# Patient Record
Sex: Female | Born: 1995 | Race: Black or African American | Hispanic: No | Marital: Single | Smoking: Never smoker
Health system: Southern US, Community
[De-identification: ages and names within clinical notes are randomized; demographics above are authoritative.]

## PROBLEM LIST (undated history)

## (undated) ENCOUNTER — Inpatient Hospital Stay: Payer: Self-pay

## (undated) DIAGNOSIS — IMO0002 Reserved for concepts with insufficient information to code with codable children: Secondary | ICD-10-CM

## (undated) DIAGNOSIS — IMO0001 Reserved for inherently not codable concepts without codable children: Secondary | ICD-10-CM

## (undated) DIAGNOSIS — Z3490 Encounter for supervision of normal pregnancy, unspecified, unspecified trimester: Secondary | ICD-10-CM

---
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
## 2015-02-04 IMAGING — US US OB < 14 WEEKS - US OB TV
1 series · 14 of 28 positions shown · non-contrast
Comparison: None.

CLINICAL DATA: Nausea, vomiting and diarrhea for 1 week, diffuse
abdominal pain. Unknown last menstrual period. Positive pregnancy
test.

EXAM:
OBSTETRIC <14 WK US AND TRANSVAGINAL OB US
TECHNIQUE: Both transabdominal and transvaginal ultrasound examinations were
performed for complete evaluation of the gestation as well as the
maternal uterus, adnexal regions, and pelvic cul-de-sac.
Transvaginal technique was performed to assess early pregnancy.

[Series 1: us ob < 14 weeks - us ob tv · 0.20mm/px · 14 of 64 slices shown]
[im 3/64]
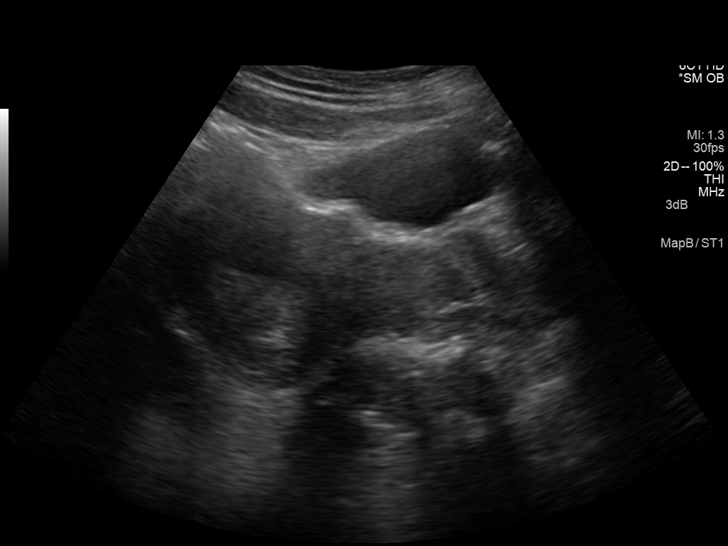
[im 8/64]
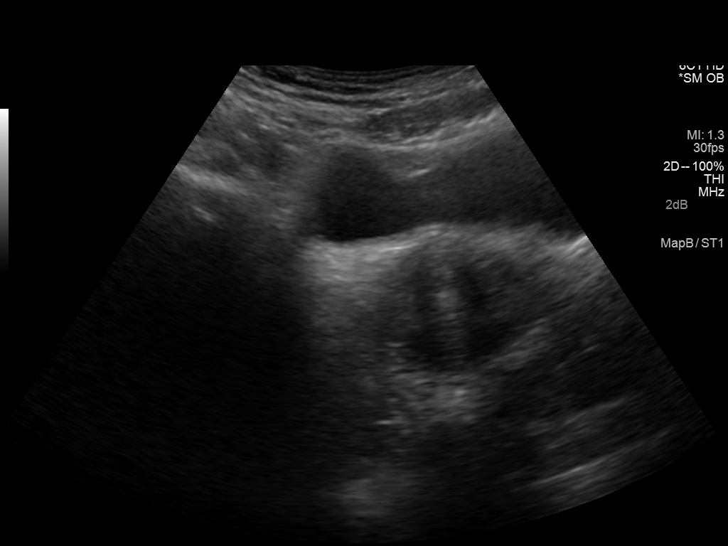
[im 12/64]
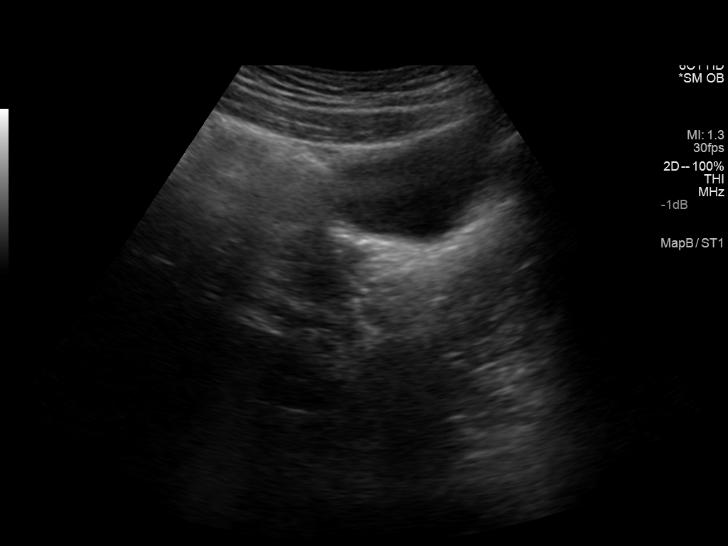
[im 17/64]
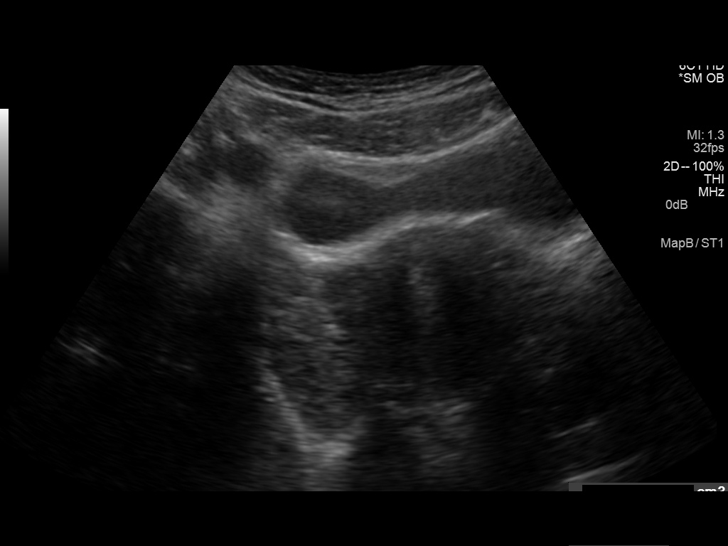
[im 22/64]
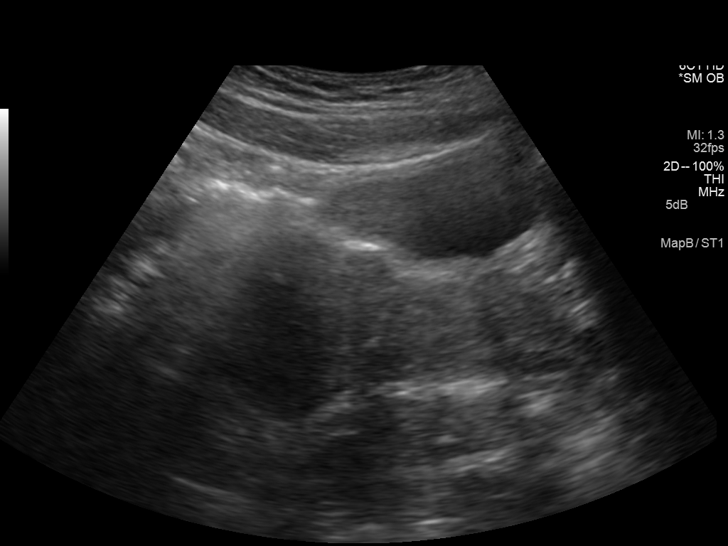
[im 26/64]
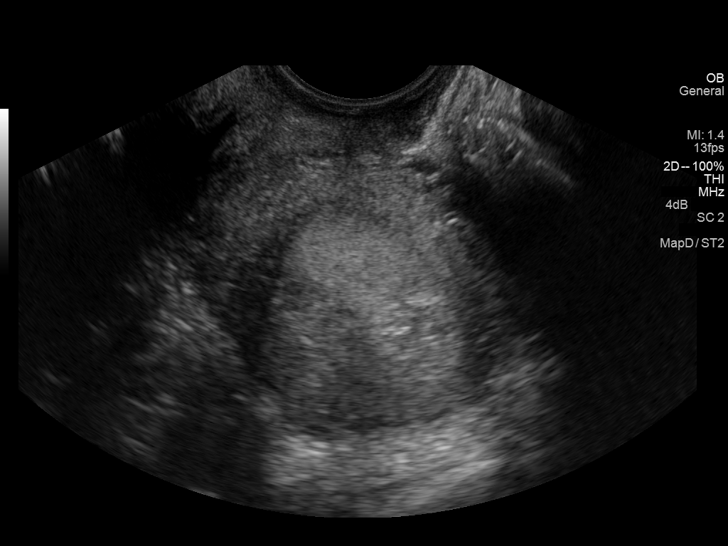
[im 31/64]
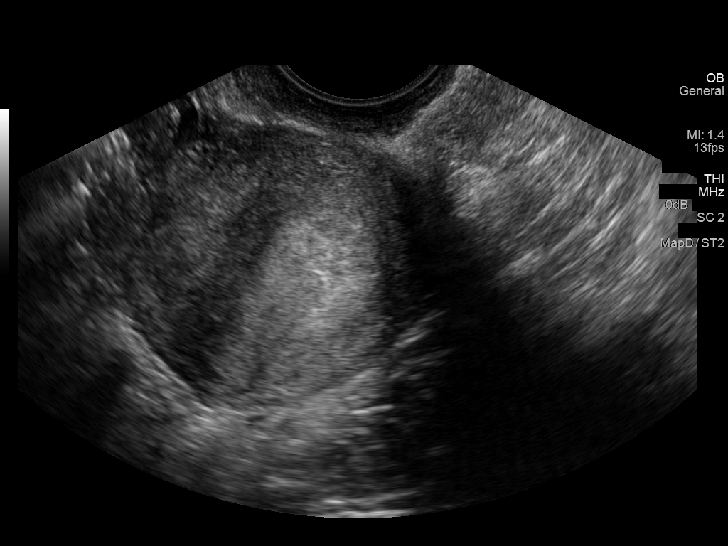
[im 36/64]
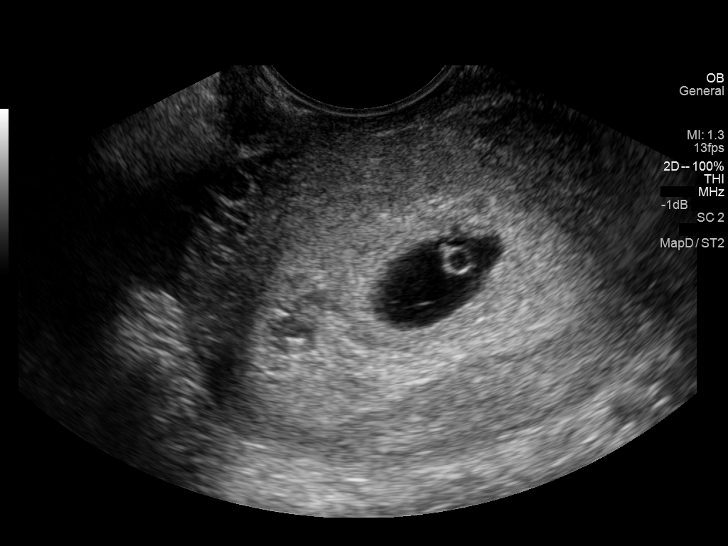
[im 40/64]
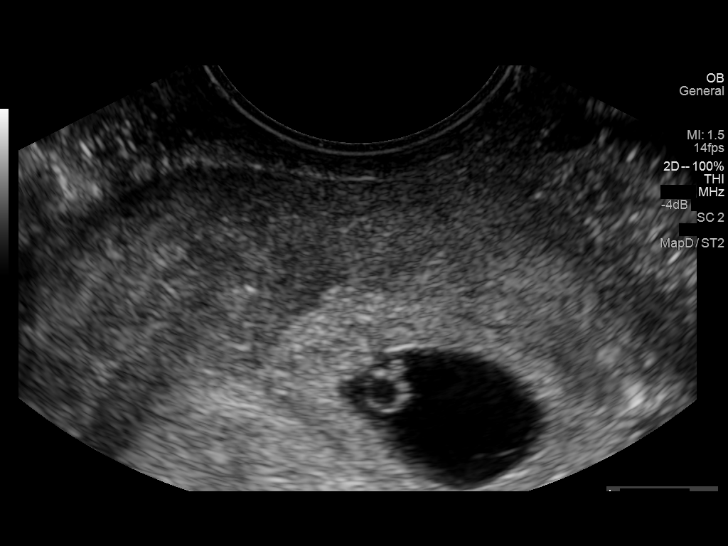
[im 45/64]
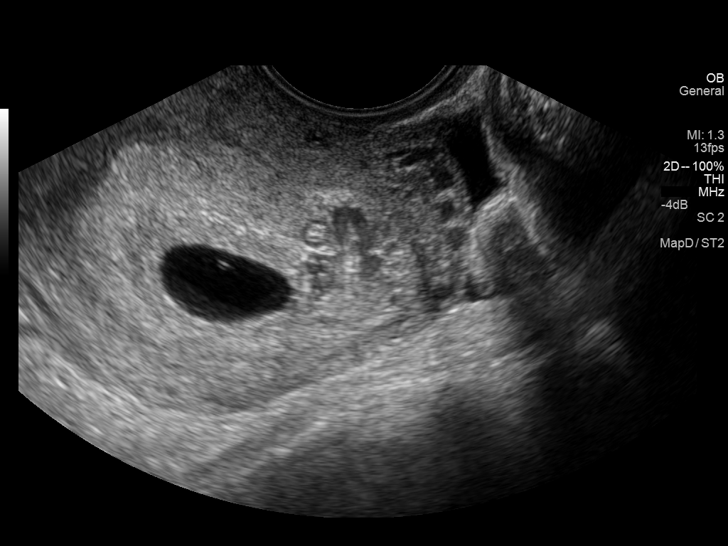
[im 50/64]
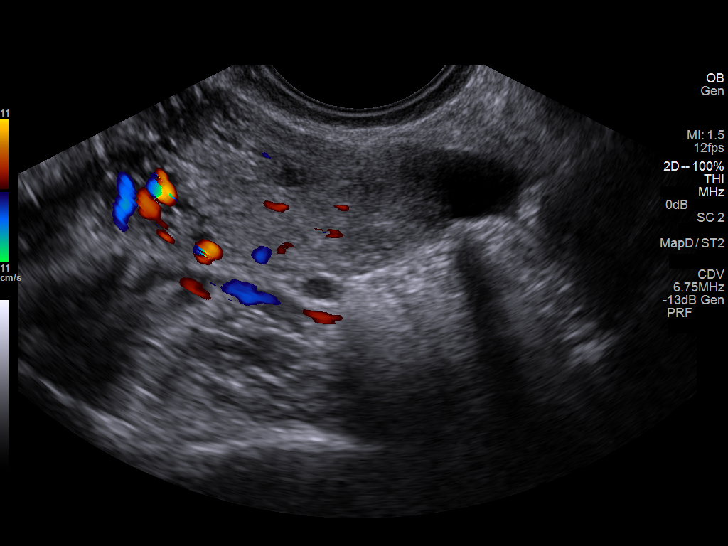
[im 54/64]
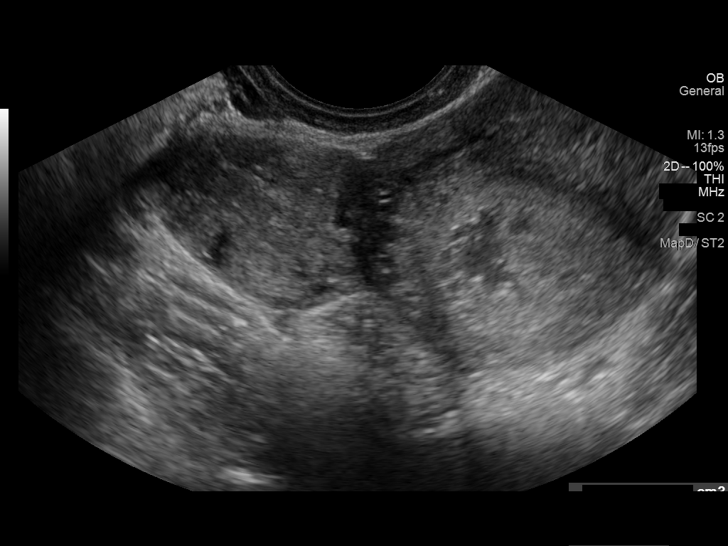
[im 59/64]
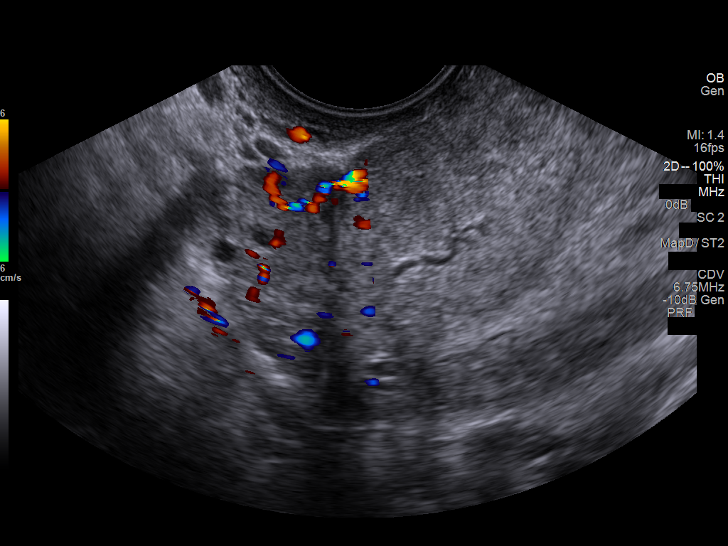
[im 64/64]
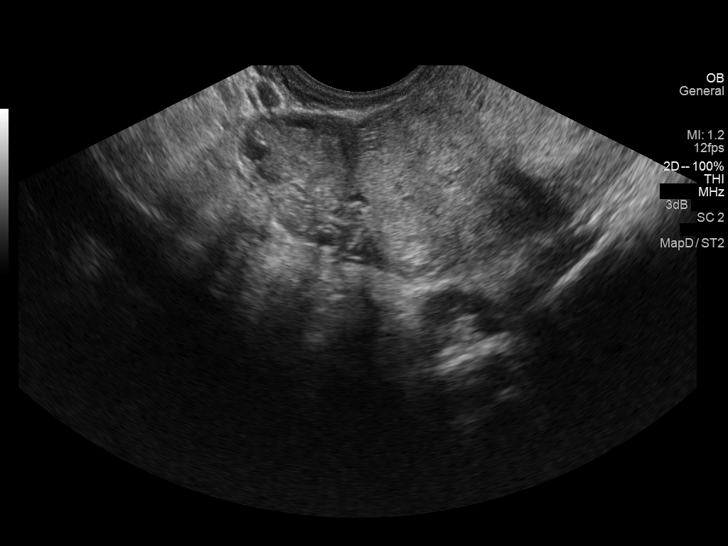

[14 of 28 positions shown; findings below may reference images not displayed]

FINDINGS: Intrauterine gestational sac: Visualized/normal in shape.

Yolk sac:  Present

Embryo:  Present

Cardiac Activity: Present

Heart Rate:  108 bpm

MSD:  2  mm   5 w   5  d

US EDC: October 27, 2014

Maternal uterus/adnexae: The subchorionic hemorrhage. Normal
appearance of the adnexae. No free fluid.
IMPRESSION: Single live intrauterine pregnancy, gestational age 5 weeks and 5
days by ultrasound, EDD October 27, 2014 without immediate
complications.

  By: Baxi Lance

## 2015-04-01 ENCOUNTER — Emergency Department
Admission: EM | Admit: 2015-04-01 | Discharge: 2015-04-01 | Disposition: A | Discharge status: Home / Self Care | Payer: Medicaid Other | Attending: Emergency Medicine | Admitting: Emergency Medicine

## 2015-04-01 ENCOUNTER — Encounter: Payer: Self-pay | Admitting: Emergency Medicine

## 2015-04-01 DIAGNOSIS — L02416 Cutaneous abscess of left lower limb: Secondary | ICD-10-CM | POA: Insufficient documentation

## 2015-04-01 DIAGNOSIS — L03116 Cellulitis of left lower limb: Secondary | ICD-10-CM | POA: Diagnosis not present

## 2015-04-01 DIAGNOSIS — Z79899 Other long term (current) drug therapy: Secondary | ICD-10-CM | POA: Diagnosis not present

## 2015-04-01 DIAGNOSIS — L0291 Cutaneous abscess, unspecified: Secondary | ICD-10-CM

## 2015-04-01 DIAGNOSIS — A4902 Methicillin resistant Staphylococcus aureus infection, unspecified site: Secondary | ICD-10-CM | POA: Insufficient documentation

## 2015-04-01 DIAGNOSIS — L0390 Cellulitis, unspecified: Secondary | ICD-10-CM

## 2015-04-01 MED ORDER — SULFAMETHOXAZOLE-TRIMETHOPRIM 800-160 MG PO TABS
1.0000 | ORAL_TABLET | Freq: Once | ORAL | Status: AC
  Administered 2015-04-01: 1 via ORAL
  Filled 2015-04-01: qty 1

## 2015-04-01 MED ORDER — SULFAMETHOXAZOLE-TRIMETHOPRIM 800-160 MG PO TABS: 1.0000 | ORAL_TABLET | Freq: Two times a day (BID) | ORAL | Status: DC

## 2015-04-01 NOTE — Discharge Instructions (Signed)
Abscess An abscess (boil or furuncle) is an infected area on or under the skin. This area is filled with yellowish-white fluid (pus) and other material (debris). HOME CARE   Only take medicines as told by your doctor.  If you were given antibiotic medicine, take it as directed. Finish the medicine even if you start to feel better.  If gauze is used, follow your doctor's directions for changing the gauze.  To avoid spreading the infection:  Keep your abscess covered with a bandage.  Wash your hands well.  Do not share personal care items, towels, or whirlpools with others.  Avoid skin contact with others.  Keep your skin and clothes clean around the abscess.  Keep all doctor visits as told. GET HELP RIGHT AWAY IF:   You have more pain, puffiness (swelling), or redness in the wound site.  You have more fluid or blood coming from the wound site.  You have muscle aches, chills, or you feel sick.  You have a fever. MAKE SURE YOU:   Understand these instructions.  Will watch your condition.  Will get help right away if you are not doing well or get worse.   This information is not intended to replace advice given to you by your health care provider. Make sure you discuss any questions you have with your health care provider.   Document Released: 10/18/2007 Document Revised: 10/31/2011 Document Reviewed: 07/15/2011 Elsevier Interactive Patient Education 2016 [employer].   MRSA Infection, Adult MRSA stands for methicillin-resistant Staphylococcus aureus. This type of infection is caused by Staphylococcus aureus bacteria that are no longer affected by the medicines used to kill them (drug resistant). Staphylococcus (staph) bacteria are normally found on the skin or in the nose of healthy people. In most cases, these bacteria do not cause infection. But if these resistant bacteria enter your body through a cut or sore, they can cause a serious infection on your skin or in  other parts of your body. There is a slight chance that the staph on your skin or in your nose is MRSA. There are two types of MRSA infections:  Hospital-acquired MRSA is bacteria that you get in the hospital.  Community-acquired MRSA is bacteria that you get somewhere other than in a hospital. RISK FACTORS Hospital-acquired MRSA is more common. You could be at risk for this infection if you are in the hospital and you:  Have surgery or a procedure.  Have an IV access or a catheter tube placed in your body.  Have weak resistance to germs (weakened immune system).  Are elderly.  Are on kidney dialysis. You could be at risk for community-acquired MRSA if you have a break in your skin and come into contact with MRSA. This may happen if you:  Play sports where there is skin-to-skin contact.  Live in a crowded setting, like a dormitory or a [employer].  Share towels, razors, or sports equipment with other people. SYMPTOMS  Symptoms of hospital-acquired MRSA depend on where MRSA has spread. Symptoms may include:  Wound infection.  Skin infection.  Rash.  Pneumonia.  Fever and chills.  Difficulty breathing.  Chest pain. Community-acquired MRSA is most likely to start as a scratch or cut that becomes infected. Symptoms may include:  A pus-filled pimple.  A boil on your skin.  Pus draining from your skin.  A sore (abscess) under your skin or somewhere in your body.  Fever with or without chills. DIAGNOSIS  The diagnosis of MRSA is  made by taking a sample from an infected area and sending it to a lab for testing. A lab technician can grow (culture) MRSA and check it under a microscope. The cultured MRSA can be tested to see which type of antibiotic medicine will work to treat it. Newer tests can identify MRSA more quickly by testing bacteria samples for MRSA genes. Your health care provider can diagnose MRSA using samples from:   Cuts or wounds in infected  areas.  Nasal swabs.  Saliva or cough specimens from deep in the lungs (sputum).  Urine.  Blood. You may also have:  Imaging studies (such as X-ray or MRI) to check if the infection has spread to the lungs, bones, or joints.  A culture and sensitivity test of blood or fluids from inside the joints. TREATMENT  Treatment depends on how severe, deep, or extensive the infection is. Very bad infections may require a hospital stay.  Some skin infections, such as a small boil or sore (abscess), may be treated by draining pus from the site of the infection.  More extensive surgery to drain pus may be necessary for deeper or more widespread soft tissue infections.  You may then have to take antibiotic medicine given by mouth or through a vein. You may start antibiotic treatment right away or after testing can be done to see what antibiotic medicine should be used. HOME CARE INSTRUCTIONS   Take your antibiotics as directed by your health care provider. Take the medicine as prescribed until it is finished.  Avoid close contact with those around you as much as possible. Do not use towels, razors, toothbrushes, bedding, or other items that will be used by others.  Wash your hands frequently for 15 seconds with soap and water. Dry your hands with a clean or disposable towel.  When you are not able to wash your hands, use hand sanitizer that is more than 60 percent alcohol.  Wash towels, sheets, or clothes in the washing machine with detergent and hot water. Dry them in a hot dryer.  Follow your health care provider's instructions for wound care. Wash your hands before and after changing your bandages.  Always shower after exercising.  Keep all cuts and scrapes clean and covered with a bandage.  Be sure to tell all your health care providers that you have MRSA so they are aware of your infection. SEEK MEDICAL CARE IF:  You have a cut, scrape, pimple, or boil that becomes red, swollen, or  painful or has pus in it.  You have pus draining from your skin.  You have an abscess under your skin or somewhere in your body. SEEK IMMEDIATE MEDICAL CARE IF:   You have symptoms of a skin infection with a fever or chills.  You have trouble breathing.  You have chest pain.  You have a skin wound and you become nauseous or start vomiting. MAKE SURE YOU:  Understand these instructions.  Will watch your condition.  Will get help right away if you are not doing well or get worse.   This information is not intended to replace advice given to you by your health care provider. Make sure you discuss any questions you have with your health care provider.   Document Released: 05/01/2005 Document Revised: 09/15/2014 Document Reviewed: 02/21/2013 Elsevier Interactive Patient Education 2016 [employer]   Take antibiotics as directed for infection. You may also purchase chlorhexidine soap at the pharmacy and shower daily for a week to help kill MRSA.  Follow-up as needed if not improving.

## 2015-04-01 NOTE — ED Provider Notes (Signed)
[facility] Emergency Department Provider Note  ____________________________________________  Time seen: Approximately 8:53 PM  I have reviewed the triage vital signs and the nursing notes.   HISTORY  Chief Complaint Abscess    HPI Julia Lewis is a 19 y.o. female with history of MRSA who presents with focal swelling to the left calf. Associated also with erythema. Area is tender and is improving overall. However she wanted the area evaluate. No fevers and chills.   Past Medical History  Diagnosis Date  . Asthma   . IUGR (intrauterine growth restriction)   . Teen mom   . Unplanned pregnancy     Patient Active Problem List   Diagnosis Date Noted  . Vaginal delivery 10/23/2014  . IUGR (intrauterine growth restriction) 10/13/2014  . Intrauterine growth restriction affecting antepartum care of mother   . [redacted] weeks gestation of pregnancy   . Poor fetal growth affecting management of mother in third trimester, antepartum   . Indication for care in labor and delivery, antepartum 09/16/2014    History reviewed. No pertinent past surgical history.  Current Outpatient Rx  Name  Route  Sig  Dispense  Refill  . albuterol (PROVENTIL HFA;VENTOLIN HFA) 108 (90 BASE) MCG/ACT inhaler   Inhalation   Inhale 2 puffs into the lungs every 6 (six) hours as needed for wheezing or shortness of breath.   1 Inhaler   2   . HYDROcodone-acetaminophen (NORCO) 5-325 MG per tablet   Oral   Take 1 tablet by mouth every 6 (six) hours as needed for moderate pain.   20 tablet   0   . ibuprofen (ADVIL,MOTRIN) 600 MG tablet   Oral   Take 1 tablet (600 mg total) by mouth every 6 (six) hours.   30 tablet   0   . methylPREDNISolone (MEDROL DOSEPAK) 4 MG TBPK tablet      Take Tapered dose as directed   21 tablet   0   . norethindrone (ORTHO MICRONOR) 0.35 MG tablet   Oral   Take 1 tablet (0.35 mg total) by mouth daily.   1 Package   11   . Prenatal Vit-Fe  Fumarate-FA (PRENATAL MULTIVITAMIN) TABS tablet   Oral   Take 1 tablet by mouth daily at 12 noon.         . sulfamethoxazole-trimethoprim (BACTRIM DS,SEPTRA DS) 800-160 MG tablet   Oral   Take 1 tablet by mouth 2 (two) times daily.   14 tablet   0   . witch hazel-glycerin (TUCKS) pad   Topical   Apply 1 application topically as needed for hemorrhoids.   40 each   12     Allergies Review of patient's allergies indicates no known allergies.  Family History  Problem Relation Age of Onset  . Stroke Maternal Grandmother 15    Social History Social History  Substance Use Topics  . Smoking status: Never Smoker   . Smokeless tobacco: Never Used  . Alcohol Use: No    Review of Systems Constitutional: No fever/chills Eyes: No visual changes. ENT: No sore throat. Cardiovascular: Denies chest pain. Respiratory: Denies shortness of breath. Gastrointestinal: No abdominal pain.  No nausea, no vomiting.  No diarrhea.  No constipation. Genitourinary: Negative for dysuria. Musculoskeletal: Negative for back pain. Skin: Negative for rash. Neurological: Negative for headaches, focal weakness or numbness. 10-point ROS otherwise negative.  ____________________________________________   PHYSICAL EXAM:  VITAL SIGNS: ED Triage Vitals  Enc Vitals Group     BP  04/01/15 2012 105/54 mmHg     Pulse Rate 04/01/15 2012 56     Resp 04/01/15 2012 15     Temp 04/01/15 2012 98.2 F (36.8 C)     Temp Source 04/01/15 2012 Oral     SpO2 04/01/15 2012 100 %     Weight 04/01/15 2012 125 lb (56.7 kg)     Height 04/01/15 2012 5\' 1"  (1.549 m)     Head Cir --      Peak Flow --      Pain Score 04/01/15 2023 8     Pain Loc --      Pain Edu? --      Excl. in GC? --     Constitutional: Alert and oriented. Well appearing and in no acute distress. Eyes: Conjunctivae are normal. PERRL. EOMI. Ears:  Clear with normal landmarks. No erythema. Head: Atraumatic. Nose: No  congestion/rhinnorhea. Mouth/Throat: Mucous membranes are moist.  Oropharynx non-erythematous. No lesions. Neck:  Supple.  No adenopathy.   Cardiovascular: Normal rate, regular rhythm. Grossly normal heart sounds.  Good peripheral circulation. Respiratory: Normal respiratory effort.  No retractions. Lungs CTAB. Gastrointestinal: Soft and nontender. No distention. No abdominal bruits. No CVA tenderness. Musculoskeletal: Nml ROM of upper and lower extremity joints. Neurologic:  Normal speech and language. No gross focal neurologic deficits are appreciated. No gait instability. Skin: 1 cm induration to the left calf with surrounding erythema. No pointing or fluctuance. Psychiatric: Mood and affect are normal. Speech and behavior are normal.  ____________________________________________   LABS (all labs ordered are listed, but only abnormal results are displayed)  Labs Reviewed - No data to display ____________________________________________  EKG   ____________________________________________  RADIOLOGY   ____________________________________________   PROCEDURES  Procedure(s) performed: None  Critical Care performed: No  ____________________________________________   INITIAL IMPRESSION / ASSESSMENT AND PLAN / ED COURSE  Pertinent labs & imaging results that were available during my care of the patient were reviewed by me and considered in my medical decision making (see chart for details).  19 year old with history of MRSA who presents with indurated area and surrounding erythema to the left calf. She reports actual improvement in the infection becoming less tender. Still recommended antibiotics, Septra DS for 1 week. Also instructed in Hibiclens soap treatment. May [facility] as needed, or return to the emergency room for any concerns. ____________________________________________   FINAL CLINICAL IMPRESSION(S) / ED DIAGNOSES  Final diagnoses:  Abscess  and cellulitis  MRSA (methicillin resistant Staphylococcus aureus)      Julia Lewis Julia Karel, PA-C 04/01/15 2055  Sharman CheekPhillip Stafford, MD 04/02/15 0111

## 2015-04-01 NOTE — ED Notes (Signed)
Pt presents with quarter size reddened and swollen area to back of left calf.

## 2015-09-03 IMAGING — US US OB FOLLOW-UP
1 series · 14 of 28 positions shown · non-contrast
Comparison: none

[Series 1: us ob follow-up · 0.28mm/px · 14 of 40 slices shown]
[im 2/40]
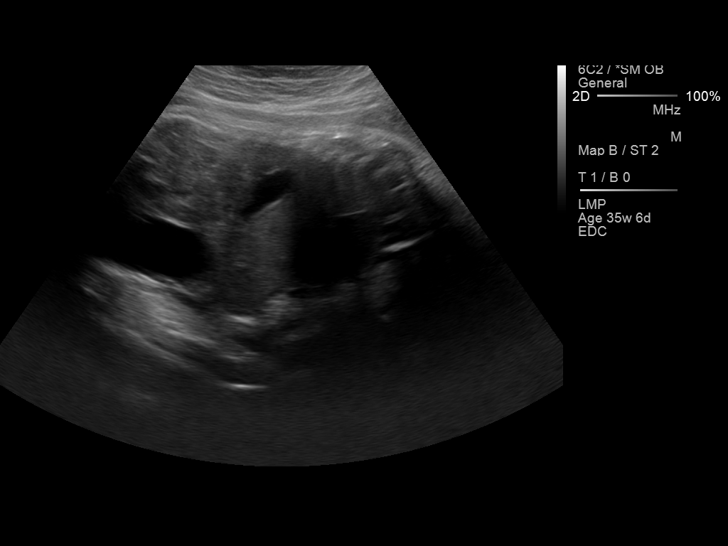
[im 5/40]
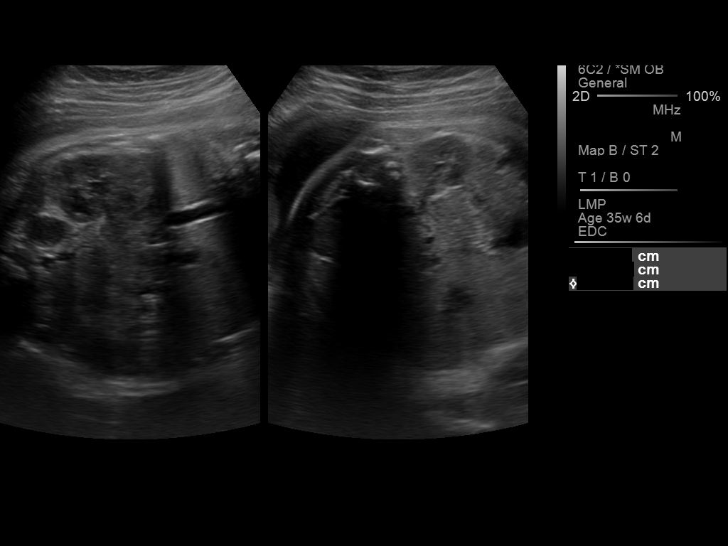
[im 8/40]
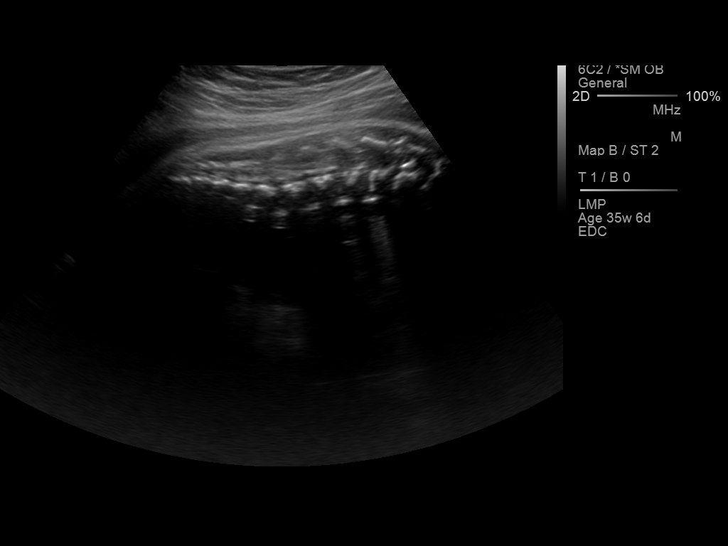
[im 11/40]
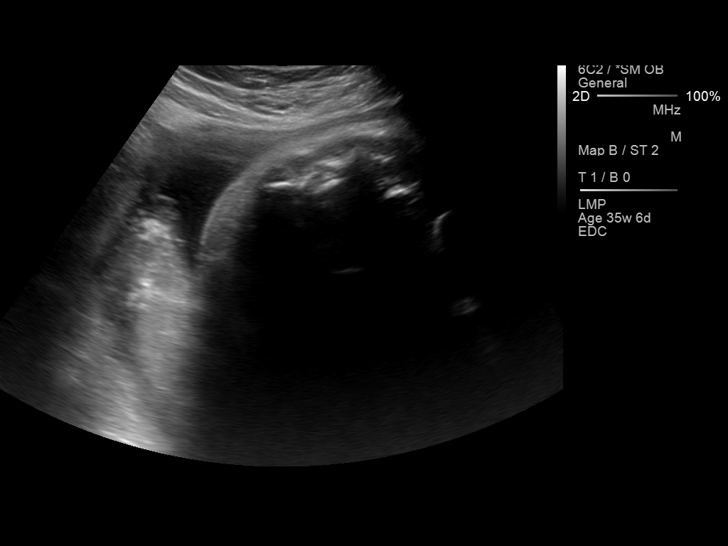
[im 14/40]
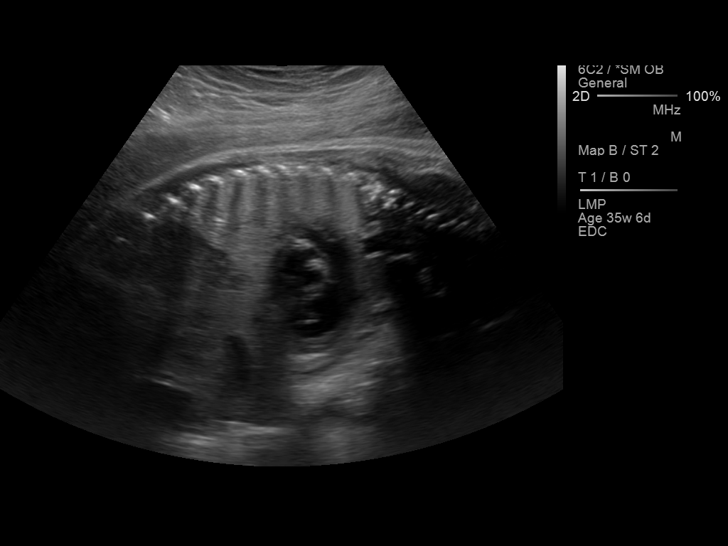
[im 16/40]
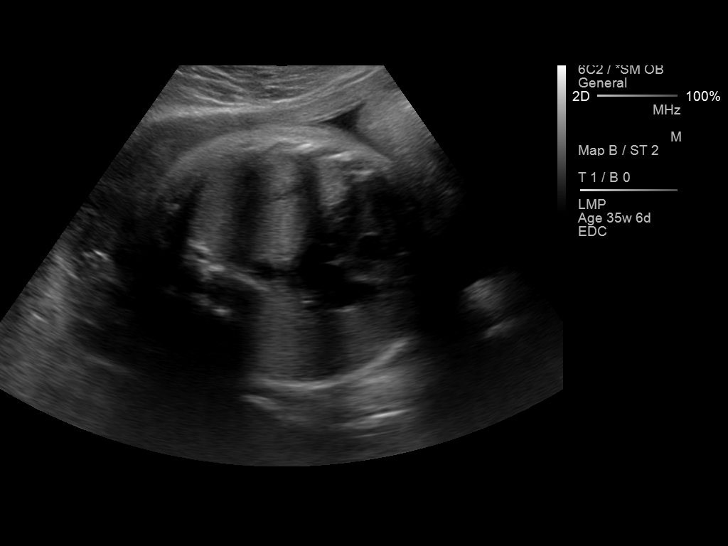
[im 19/40]
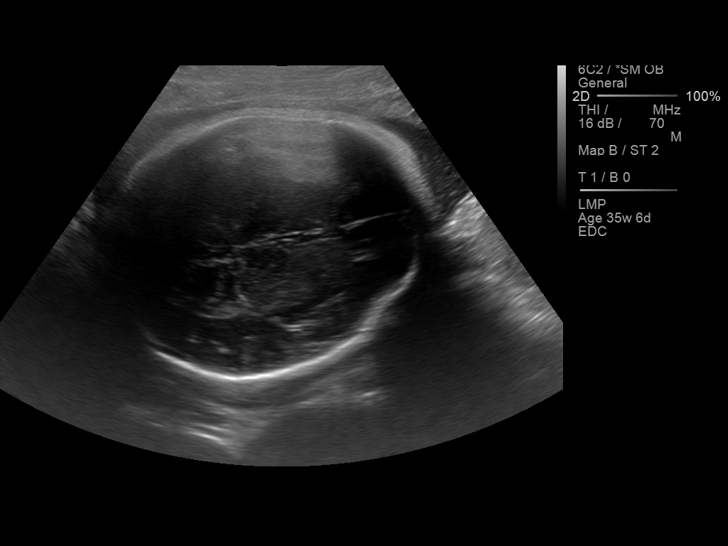
[im 22/40]
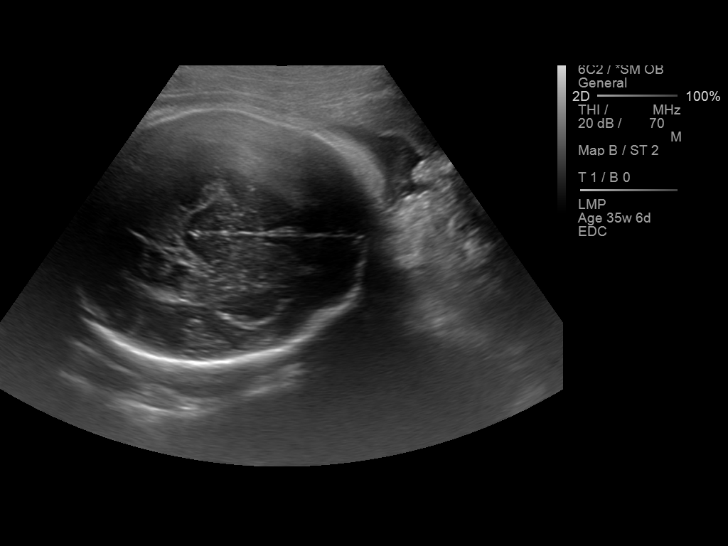
[im 25/40]
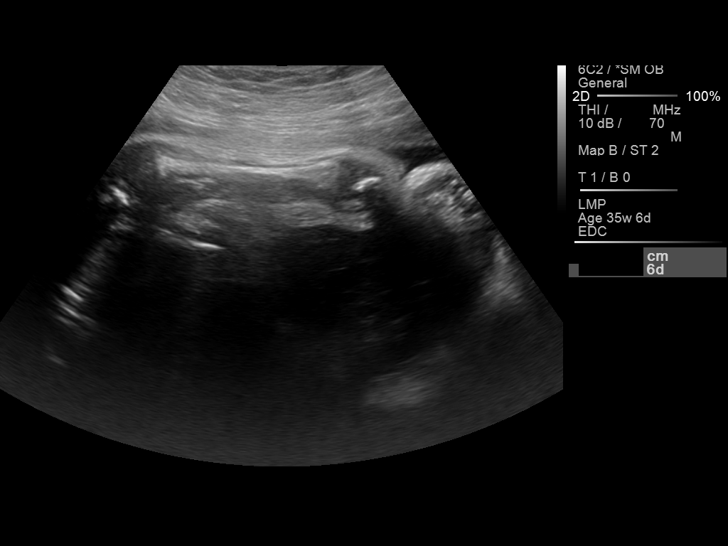
[im 28/40]
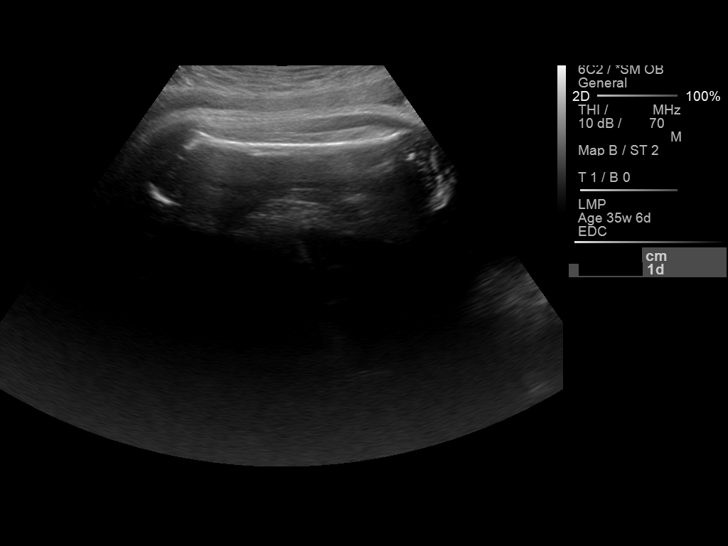
[im 31/40]
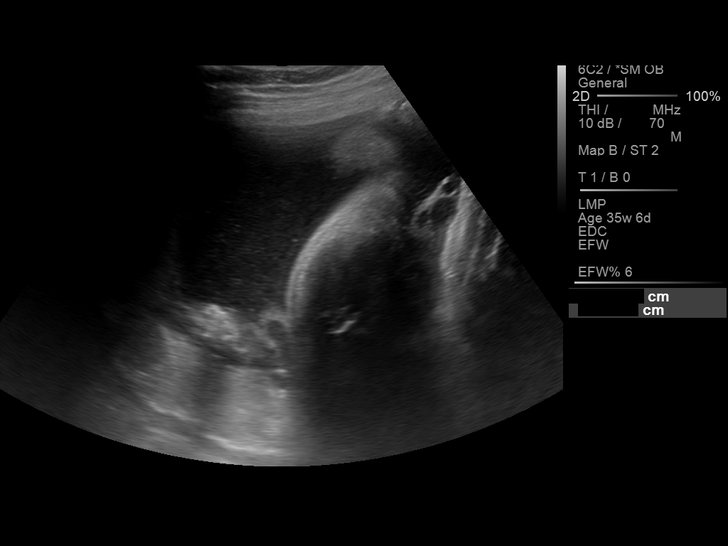
[im 34/40]
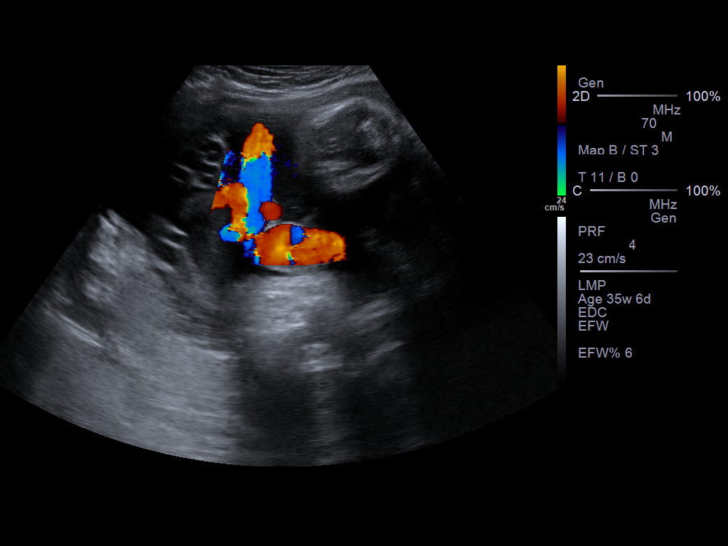
[im 37/40]
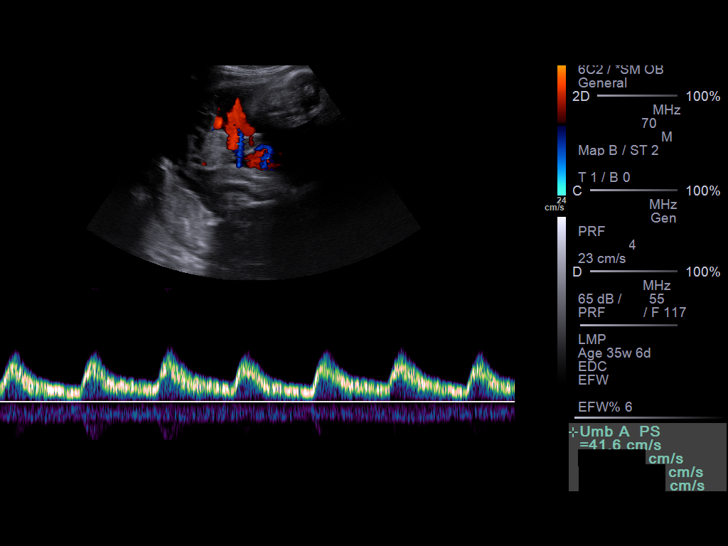
[im 40/40]
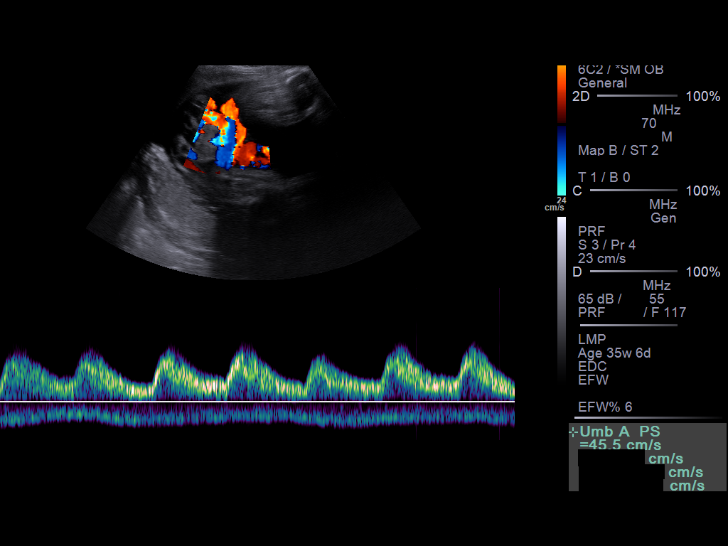

[14 of 28 positions shown; findings below may reference images not displayed]

Canned report from images found in remote index.

Refer to host system for actual result text.

## 2015-11-01 ENCOUNTER — Emergency Department
Admission: EM | Admit: 2015-11-01 | Discharge: 2015-11-01 | Disposition: A | Discharge status: Home / Self Care | Payer: Medicaid Other | Attending: Emergency Medicine | Admitting: Emergency Medicine

## 2015-11-01 DIAGNOSIS — F1290 Cannabis use, unspecified, uncomplicated: Secondary | ICD-10-CM | POA: Insufficient documentation

## 2015-11-01 DIAGNOSIS — J45901 Unspecified asthma with (acute) exacerbation: Secondary | ICD-10-CM | POA: Diagnosis not present

## 2015-11-01 DIAGNOSIS — R0602 Shortness of breath: Secondary | ICD-10-CM | POA: Diagnosis present

## 2015-11-01 MED ORDER — ALBUTEROL SULFATE (2.5 MG/3ML) 0.083% IN NEBU
2.5000 mg | INHALATION_SOLUTION | Freq: Once | RESPIRATORY_TRACT | Status: AC
  Administered 2015-11-01: 2.5 mg via RESPIRATORY_TRACT
  Filled 2015-11-01: qty 3

## 2015-11-01 MED ORDER — ALBUTEROL SULFATE HFA 108 (90 BASE) MCG/ACT IN AERS: 1.0000 | INHALATION_SPRAY | Freq: Four times a day (QID) | RESPIRATORY_TRACT | Status: AC | PRN

## 2015-11-01 NOTE — Discharge Instructions (Signed)
Asthma, Acute Bronchospasm °Acute bronchospasm caused by asthma is also referred to as an asthma attack. Bronchospasm means your air passages become narrowed. The narrowing is caused by inflammation and tightening of the muscles in the air tubes (bronchi) in your lungs. This can make it hard to breathe or cause you to wheeze and cough. °CAUSES °Possible triggers are: °· Animal dander from the skin, hair, or feathers of animals. °· Dust mites contained in house dust. °· Cockroaches. °· Pollen from trees or grass. °· Mold. °· Cigarette or tobacco smoke. °· Air pollutants such as dust, household cleaners, hair sprays, aerosol sprays, paint fumes, strong chemicals, or strong odors. °· Cold air or weather changes. Cold air may trigger inflammation. Winds increase molds and pollens in the air. °· Strong emotions such as crying or laughing hard. °· Stress. °· Certain medicines such as aspirin or beta-blockers. °· Sulfites in foods and drinks, such as dried fruits and wine. °· Infections or inflammatory conditions, such as a flu, cold, or inflammation of the nasal membranes (rhinitis). °· Gastroesophageal reflux disease (GERD). GERD is a condition where stomach acid backs up into your esophagus. °· Exercise or strenuous activity. °SIGNS AND SYMPTOMS  °· Wheezing. °· Excessive coughing, particularly at night. °· Chest tightness. °· Shortness of breath. °DIAGNOSIS  °Your health care provider will ask you about your medical history and perform a physical exam. A chest X-ray or blood testing may be performed to look for other causes of your symptoms or other conditions that may have triggered your asthma attack.  °TREATMENT  °Treatment is aimed at reducing inflammation and opening up the airways in your lungs.  Most asthma attacks are treated with inhaled medicines. These include quick relief or rescue medicines (such as bronchodilators) and controller medicines (such as inhaled corticosteroids). These medicines are sometimes  given through an inhaler or a nebulizer. Systemic steroid medicine taken by mouth or given through an IV tube also can be used to reduce the inflammation when an attack is moderate or severe. Antibiotic medicines are only used if a bacterial infection is present.  °HOME CARE INSTRUCTIONS  °· Rest. °· Drink plenty of liquids. This helps the mucus to remain thin and be easily coughed up. Only use caffeine in moderation and do not use alcohol until you have recovered from your illness. °· Do not smoke. Avoid being exposed to secondhand smoke. °· You play a critical role in keeping yourself in good health. Avoid exposure to things that cause you to wheeze or to have breathing problems. °· Keep your medicines up-to-date and available. Carefully follow your health care provider's treatment plan. °· Take your medicine exactly as prescribed. °· When pollen or pollution is bad, keep windows closed and use an air conditioner or go to places with air conditioning. °· Asthma requires careful medical care. See your health care provider for a follow-up as advised. If you are more than [redacted] weeks pregnant and you were prescribed any new medicines, let your obstetrician know about the visit and how you are doing. Follow up with your health care provider as directed. °· After you have recovered from your asthma attack, make an appointment with your outpatient doctor to talk about ways to reduce the likelihood of future attacks. If you do not have a doctor who manages your asthma, make an appointment with a primary care doctor to discuss your asthma. °SEEK IMMEDIATE MEDICAL CARE IF:  °· You are getting worse. °· You have trouble breathing. If severe, call your local   emergency services (911 in the [place]  You develop chest pain or discomfort.  You are vomiting.  You are not able to keep fluids down.  You are coughing up yellow, green, brown, or bloody sputum.  You have a fever and your symptoms suddenly get worse.  You have  trouble swallowing. MAKE SURE YOU:   Understand these instructions.  Will watch your condition.  Will get help right away if you are not doing well or get worse.   This information is not intended to replace advice given to you by your health care provider. Make sure you discuss any questions you have with your health care provider.   Document Released: 08/16/2006 Document Revised: 05/06/2013 Document Reviewed: 11/06/2012 Elsevier Interactive Patient Education 2016 Elsevier Inc.  Asthma Action Plan, Adult Name: ________________________________ Date: _______ Follow-Up Visit With Health Care Provider Bring your medicines to your follow-up visits.  Health care provider name: ____________________  Telephone: ____________________  How often should I see my health care provider? ____________________ The actions that you should take to control your asthma are based on the symptoms that you are having. The condition can be divided into 3 zones: the green zone, yellow zone, and red zone. Follow the action steps for the zone that you are in each day. GREEN ZONE: WHEN ASTHMA IS UNDER CONTROL SIGNS AND SYMPTOMS You may not have any symptoms while you are in the green zone. This means that you:  Have no coughing or wheezing, even while you are working or playing.  Sleep through the night.  Are breathing well. If you use a peak flow meter: The peak flow is above ______ (80% of your personal best or greater). You should take these medicines every day:  Controller medicine and dosage: ________________  Controller medicine and dosage: ________________  Controller medicine and dosage: ________________  Controller medicine and dosage: ________________  Before exercise, use this reliever or rescue medicine: ________________ Call your health care provider if:  You are using a reliever or rescue medicine more than 2-3 times per week. YELLOW ZONE: WHEN ASTHMA IS GETTING WORSE SIGNS AND  SYMPTOMS When you are in the yellow zone, you may have symptoms that interfere with exercise, are noticeably worse after exposure to triggers, or are worse at the first sign of a cold (upper respiratory infection). These may include:  Waking from sleep.  Coughing, especially at night or first thing in the morning.  Mild wheezing.  Chest tightness. If you use a peak flow meter: The peak flow is _____ to _____ (50-79% of your personal best). Add the following medicine to the ones that you use daily:  Reliever or rescue medicine and dosage: ________________  Additional medicine and dosage: ________________ Call your health care provider if:  You are using a reliever or rescue medicine more than 2-3 times per week.  You remain in the yellow zone for _____ hours. RED ZONE: WHEN ASTHMA IS SEVERE  SIGNS AND SYMPTOMS You will likely feel distressed and have symptoms at rest that restrict your activity. You are in the red zone if:  Your breathing is hard and quickly.  Your nose opens wide, your ribs show, and your neck muscles become visible when you breathe in.  Your lips, fingers, or toes are a bluish color.  You have trouble speaking in full sentences.  Your symptoms do not improve within 15-20 minutes after you use your reliever or rescue medicine (bronchodilator). If you use a peak flow meter: The peak flow is  less than _____ (less than 50% of your personal best). Call your local emergency services (911 in the [place] right away or seek help at the emergency department of the nearest hospital. Use your reliever or rescue medicine.  Start a nebulizer treatment or take 2-4 puffs from a metered-dose inhaler with a spacer.  Repeat this action every 15-20 minutes until help arrives. WHAT ARE SOME COMMON ASTHMA TRIGGERS? Discuss your asthma triggers with your health care provider. Some common triggers are:  Dander from the skin, hair, or feathers of animals.  Dust  mites.  Cockroaches.  Pollen from trees or grass.  Mold.  Cigarette or tobacco smoke.  Air pollutants, such as dust, household cleaners, hair sprays, aerosol sprays, scented candles, paint fumes, strong chemicals, or strong odors.  Cold air or changes in weather. Cold air may cause inflammation. Winds increase molds and pollens in the air.  Strong emotions, such as crying or laughing hard.  Stress.  Certain medicines, such as aspirin or beta blockers.  Sulfites in foods and drinks, such as dried fruits and wine.  Infections or inflammatory conditions, such as:  Flu (influenza).  Upper respiratory tract infection.  Lower respiratory tract infection (pneumonia or bronchitis).  Inflammation of the nasal membranes (rhinitis).  Gastroesophageal reflux disease (GERD). GERD is a condition in which stomach acid comes up into the throat (esophagus).  Exercise or strenuous activity.   This information is not intended to replace advice given to you by your health care provider. Make sure you discuss any questions you have with your health care provider.   Document Released: 02/26/2009 Document Revised: 05/22/2014 Document Reviewed: 02/17/2014 Elsevier Interactive Patient Education [employer]2016 Elsevier Inc.

## 2015-11-01 NOTE — ED Notes (Signed)
Pt states " I think my asthma is flaring up".. States she has been wheezing, cough since yesterday.. Pt is in NAD at present.

## 2015-11-01 NOTE — ED Provider Notes (Signed)
[facility] Emergency Department Provider Note  ____________________________________________  Time seen: Approximately 8:29 AM  I have reviewed the triage vital signs and the nursing notes.   HISTORY  Chief Complaint Asthma    HPI Julia Lewis is a 20 y.o. female , NAD, presents to the emergency department with a few day history of wheezing and chest tightness. States she has a history of asthma that flares with weather change in increase in temperatures. Unfortunately missed her appointment with her primary care provider [facility] to get a refill on her albuterol inhaler due to transportation issues. Has not had an albuterol inhaler and a couple of months and has not had any symptoms. Has been on Qvar in the past in which she states did not help her symptoms long term. Does not use albuterol on a regular basis. Denies nasal congestion, runny nose, chest congestion, cough, back pain, headaches, fever, chills, body aches. No LOC or dizziness.   Past Medical History  Diagnosis Date  . Asthma   . IUGR (intrauterine growth restriction)   . Teen mom   . Unplanned pregnancy     Patient Active Problem List   Diagnosis Date Noted  . Vaginal delivery 10/23/2014  . IUGR (intrauterine growth restriction) 10/13/2014  . Intrauterine growth restriction affecting antepartum care of mother   . [redacted] weeks gestation of pregnancy   . Poor fetal growth affecting management of mother in third trimester, antepartum   . Indication for care in labor and delivery, antepartum 09/16/2014    History reviewed. No pertinent past surgical history.  Current Outpatient Rx  Name  Route  Sig  Dispense  Refill  . albuterol (PROVENTIL HFA;VENTOLIN HFA) 108 (90 Base) MCG/ACT inhaler   Inhalation   Inhale 1-2 puffs into the lungs every 6 (six) hours as needed for wheezing or shortness of breath.   1 Inhaler   0     Allergies Review of patient's allergies indicates no known  allergies.  Family History  Problem Relation Age of Onset  . Stroke Maternal Grandmother 36    Social History Social History  Substance Use Topics  . Smoking status: Never Smoker   . Smokeless tobacco: Never Used  . Alcohol Use: No     Review of Systems  Constitutional: No fever/chills Eyes: No visual changes.  ENT: No sore throat, Nasal congestion, sinus pressure, runny nose. Cardiovascular: No chest pain. Respiratory: Positive shortness of breath, wheezing. No cough, chest congestion. Musculoskeletal: Negative for back pain, general myalgias.  Skin: Negative for rash. Neurological: Negative for headaches, focal weakness or numbness. No LOC, tingling 10-point ROS otherwise negative.  ____________________________________________   PHYSICAL EXAM:  VITAL SIGNS: ED Triage Vitals  Enc Vitals Group     BP 11/01/15 0755 130/81 mmHg     Pulse Rate 11/01/15 0755 98     Resp 11/01/15 0755 19     Temp 11/01/15 0755 98.3 F (36.8 C)     Temp Source 11/01/15 0755 Oral     SpO2 11/01/15 0755 97 %     Weight 11/01/15 0755 160 lb (72.576 kg)     Height 11/01/15 0755  (1.575 m)     Head Cir --      Peak Flow --      Pain Score --      Pain Loc --      Pain Edu? --      Excl. in GC? --      Constitutional: Alert  and oriented. Well appearing and in no acute distress. Eyes: Conjunctivae are normal.  Head: Atraumatic. Neck: Supple with FROM. Hematological/Lymphatic/Immunilogical: No cervical lymphadenopathy. Cardiovascular: Normal rate, regular rhythm. Normal S1 and S2.  Good peripheral circulation. Respiratory: Trace wheezing throughout all lung fields but air noted in all lung fields. No rhonchi or rales. Normal respiratory effort without tachypnea or retractions.  Neurologic:  Normal speech and language. No gross focal neurologic deficits are appreciated. Normal gait and posture.  Skin:  Skin is warm, dry and intact. No rash noted. Psychiatric: Mood and affect are  normal. Speech and behavior are normal. Patient exhibits appropriate insight and judgement.   ____________________________________________   LABS  None ____________________________________________  EKG  None ____________________________________________  RADIOLOGY  None ____________________________________________    PROCEDURES  Procedure(s) performed: None    Medications  albuterol (PROVENTIL) (2.5 MG/3ML) 0.083% nebulizer solution 2.5 mg (2.5 mg Nebulization Given 11/01/15 0841)   No wheeze, rhonchi or rales after administration of albuterol nebulizer treatment. Normal respiratory effort without tachypnea or retractions.   ____________________________________________   INITIAL IMPRESSION / ASSESSMENT AND PLAN / ED COURSE  Patient's diagnosis is consistent with asthma exacerbation. Patient will be discharged home with prescriptions for albuterol inhaler to use as directed. Patient is to follow up with [facility] if symptoms persist past this treatment course and for regular follow up. Patient is given ED precautions to return to the ED for any worsening or new symptoms.    ____________________________________________  FINAL CLINICAL IMPRESSION(S) / ED DIAGNOSES  Final diagnoses:  Asthma exacerbation      NEW MEDICATIONS STARTED DURING THIS VISIT:  Discharge Medication List as of 11/01/2015  9:02 AM           Hope PigeonJami L Hagler, PA-C 11/01/15 0913  Myrna Blazeravid Matthew Schaevitz, MD 11/01/15 1538

## 2016-03-10 ENCOUNTER — Encounter: Payer: Self-pay | Admitting: Emergency Medicine

## 2016-03-10 ENCOUNTER — Emergency Department
Admission: EM | Admit: 2016-03-10 | Discharge: 2016-03-10 | Disposition: A | Discharge status: Home / Self Care | Payer: Medicaid Other | Attending: Emergency Medicine | Admitting: Emergency Medicine

## 2016-03-10 DIAGNOSIS — J45909 Unspecified asthma, uncomplicated: Secondary | ICD-10-CM | POA: Diagnosis present

## 2016-03-10 DIAGNOSIS — J45901 Unspecified asthma with (acute) exacerbation: Secondary | ICD-10-CM | POA: Insufficient documentation

## 2016-03-10 MED ORDER — ALBUTEROL SULFATE HFA 108 (90 BASE) MCG/ACT IN AERS: 2.0000 | INHALATION_SPRAY | Freq: Four times a day (QID) | RESPIRATORY_TRACT | 2 refills | Status: AC | PRN

## 2016-03-10 MED ORDER — PREDNISONE 20 MG PO TABS: 40.0000 mg | ORAL_TABLET | Freq: Every day | ORAL | 0 refills | Status: AC

## 2016-03-10 MED ORDER — IPRATROPIUM-ALBUTEROL 0.5-2.5 (3) MG/3ML IN SOLN
RESPIRATORY_TRACT | Status: AC
  Administered 2016-03-10: 6 mL via RESPIRATORY_TRACT
  Filled 2016-03-10: qty 6

## 2016-03-10 MED ORDER — PREDNISONE 20 MG PO TABS
40.0000 mg | ORAL_TABLET | Freq: Once | ORAL | Status: AC
  Administered 2016-03-10: 40 mg via ORAL

## 2016-03-10 MED ORDER — IPRATROPIUM-ALBUTEROL 0.5-2.5 (3) MG/3ML IN SOLN
6.0000 mL | Freq: Once | RESPIRATORY_TRACT | Status: AC
  Administered 2016-03-10: 6 mL via RESPIRATORY_TRACT

## 2016-03-10 MED ORDER — IPRATROPIUM-ALBUTEROL 0.5-2.5 (3) MG/3ML IN SOLN
3.0000 mL | Freq: Once | RESPIRATORY_TRACT | Status: AC
  Administered 2016-03-10: 3 mL via RESPIRATORY_TRACT
  Filled 2016-03-10: qty 3

## 2016-03-10 MED ORDER — PREDNISONE 20 MG PO TABS
ORAL_TABLET | ORAL | Status: AC
  Administered 2016-03-10: 40 mg via ORAL
  Filled 2016-03-10: qty 2

## 2016-03-10 NOTE — ED Provider Notes (Signed)
-----------------------------------------   7:51 AM on 03/10/2016 -----------------------------------------   Blood pressure 107/79, pulse 95, temperature 98.1 F (36.7 C), temperature source Oral, resp. rate 18, height 5\' 1"  (1.549 m), weight 126 lb (57.2 kg), last menstrual period 02/04/2016, SpO2 97 %, not currently breastfeeding.  Assuming care from Dr. Manson PasseyBrown of [name] is a 20 y.o. female with a chief complaint of Asthma .    In summary, 3F history of asthma who presents with an exacerbation after running out of her albuterol 2 months ago. No fever chills.patient received 1 DuoNeb treatment and prednisone. I reassessed her at this morning she still wheezing. We'll give her a second DuoNeb at this time. Patient's vital signs are within normal limits, normal work of breathing, satting well on room air.  _________________________ 10:53 AM on 03/10/2016 -----------------------------------------  Patient moving good air. We'll discharge home with instructions provided by Dr. Manson PasseyBrown and prescriptions. Recommended close follow-up with primary care doctor.    Julia Sicklearolina Josette Shimabukuro, MD 03/10/16 1054

## 2016-03-10 NOTE — ED Triage Notes (Signed)
Pt arrived via ems from home with complaints of chest pain and difficulty breathing. Per ems report pt was in no apparent distress. Pt has history of asthma and out of medication for 2-3 months. Pt complains of having asthma attack and feeling "wheezy."

## 2016-03-10 NOTE — ED Notes (Signed)
Pt speaking in complete sentences, cough noted. Alert and oriented. Sitting on stretcher texting. EMS gave albuterol treatment en route, pt received albuterol from EMS around 3am when she first called. Called EMS back around 6am to come to ED. EMS reports wheezing in upper lobes.

## 2016-03-10 NOTE — ED Provider Notes (Signed)
[facility] Emergency Department Provider Note    First MD Initiated Contact with Patient 03/10/16 [id number]     (approximate)  I have reviewed the triage vital signs and the nursing notes.   HISTORY  Chief Complaint Asthma    HPI Julia Lewis is a 20 y.o. female with history of asthma presents to the emergency department with "asthma attack" 1 day. Patient states that she ran out of her albuterol roughly 2-3 months ago. Patient denies any fever. However patient does admit to wheezing and intermittent cough.   Past Medical History:  Diagnosis Date  . Asthma   . IUGR (intrauterine growth restriction)   . Teen mom   . Unplanned pregnancy     Patient Active Problem List   Diagnosis Date Noted  . Vaginal delivery 10/23/2014  . IUGR (intrauterine growth restriction) 10/13/2014  . Intrauterine growth restriction affecting antepartum care of mother   . [redacted] weeks gestation of pregnancy   . Poor fetal growth affecting management of mother in third trimester, antepartum   . Indication for care in labor and delivery, antepartum 09/16/2014    Past surgical history None  Prior to Admission medications   Medication Sig Start Date End Date Taking? Authorizing Provider  albuterol (PROVENTIL HFA;VENTOLIN HFA) 108 (90 Base) MCG/ACT inhaler Inhale 1-2 puffs into the lungs every 6 (six) hours as needed for wheezing or shortness of breath. 11/01/15   Jami L Hagler, PA-C    Allergies No known drug allergies  Family History  Problem Relation Age of Onset  . Stroke Maternal Grandmother 64    Social History Social History  Substance Use Topics  . Smoking status: Never Smoker  . Smokeless tobacco: Never Used  . Alcohol use No    Review of Systems Constitutional: No fever/chills Eyes: No visual changes. ENT: No sore throat. Cardiovascular: Denies chest pain. Respiratory:Positive for wheezing and shortness of breath. Gastrointestinal: No abdominal pain.   No nausea, no vomiting.  No diarrhea.  No constipation. Genitourinary: Negative for dysuria. Musculoskeletal: Negative for back pain. Skin: Negative for rash. Neurological: Negative for headaches, focal weakness or numbness.  10-point ROS otherwise negative.  ____________________________________________   PHYSICAL EXAM:  VITAL SIGNS: ED Triage Vitals  Enc Vitals Group     BP 03/10/16 0628 135/78     Pulse Rate 03/10/16 0628 (!) 102     Resp 03/10/16 0628 (!) 24     Temp 03/10/16 0628 98.1 F (36.7 C)     Temp Source 03/10/16 0628 Oral     SpO2 03/10/16 0628 99 %     Weight --      Height --      Head Circumference --      Peak Flow --      Pain Score 03/10/16 0629 6     Pain Loc --      Pain Edu? --      Excl. in GC? --     Constitutional: Alert and oriented. Well appearing and in no acute distress. Eyes: Conjunctivae are normal. PERRL. EOMI. Head: Atraumatic. Nose: No congestion/rhinnorhea. Mouth/Throat: Mucous membranes are moist.  Oropharynx non-erythematous. Neck: No stridor.  No meningeal signs.  No cervical spine tenderness to palpation. Cardiovascular: Tachycardia regular rhythm. Good peripheral circulation. Grossly normal heart sounds. Respiratory: Tachypnea  No retractions. Bilateral expiratory wheezes Gastrointestinal: Soft and nontender. No distention.  Musculoskeletal: No lower extremity tenderness nor edema. No gross deformities of extremities. Neurologic:  Normal speech and language. No  gross focal neurologic deficits are appreciated.  Skin:  Skin is warm, dry and intact. No rash noted. Psychiatric: Mood and affect are normal. Speech and behavior are normal.    Procedures   _   INITIAL IMPRESSION / ASSESSMENT AND PLAN / ED COURSE  Pertinent labs & imaging results that were available during my care of the patient were reviewed by me and considered in my medical decision making (see chart for details).  Patient given 2 nebs 2 with prednisone 40  mg. Patient will be prescribed albuterol inhaler and prednisone for home.   Clinical Course    ____________________________________________  FINAL CLINICAL IMPRESSION(S) / ED DIAGNOSES  Final diagnoses:  Moderate asthma with exacerbation, unspecified whether persistent     MEDICATIONS GIVEN DURING THIS VISIT:  Medications  ipratropium-albuterol (DUONEB) 0.5-2.5 (3) MG/3ML nebulizer solution (not administered)  predniSONE (DELTASONE) 20 MG tablet (not administered)  ipratropium-albuterol (DUONEB) 0.5-2.5 (3) MG/3ML nebulizer solution 6 mL (not administered)  predniSONE (DELTASONE) tablet 40 mg (not administered)     NEW OUTPATIENT MEDICATIONS STARTED DURING THIS VISIT:  New Prescriptions   No medications on file    Modified Medications   No medications on file    Discontinued Medications   No medications on file     Note:  This document was prepared using Dragon voice recognition software and may include unintentional dictation errors.    Darci Currentandolph N Shauntay Brunelli, MD 03/10/16 [id number]

## 2017-03-08 IMAGING — US US UA CORD DOPPLER
1 series · 1 of 1 positions shown · non-contrast
Comparison: none

[Series 1: us ua cord doppler · 0.28mm/px · 1 of 1 slices shown]
[im 1/1]
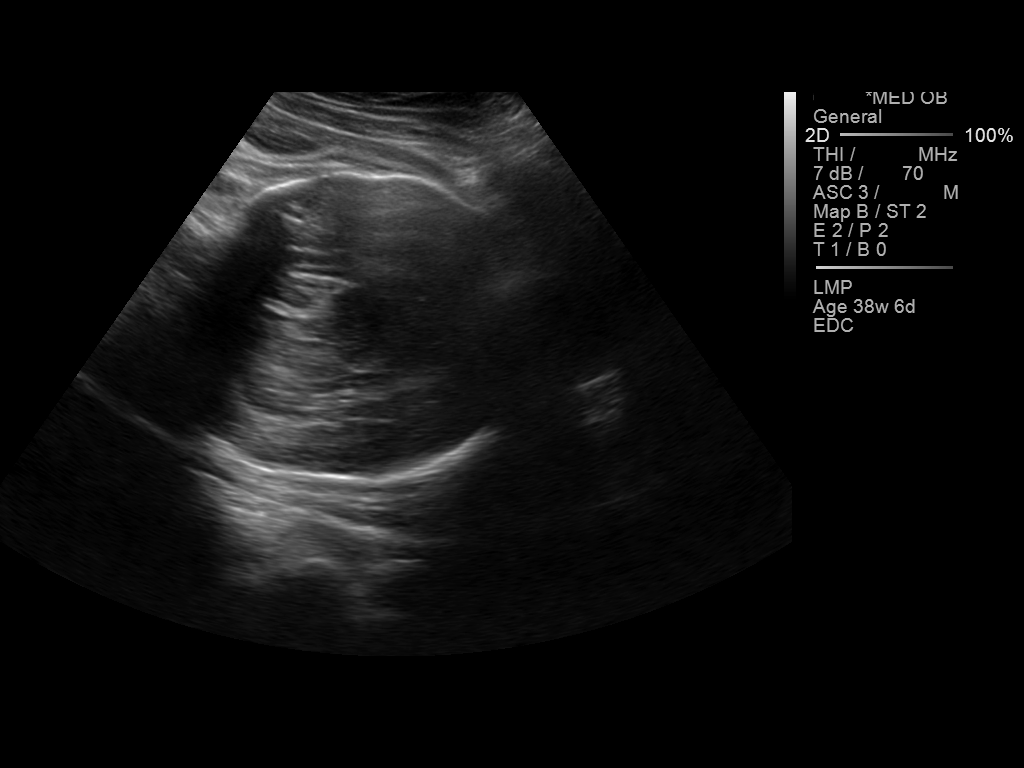

[1 of 1 positions shown; findings below may reference images not displayed]

Canned report from images found in remote index.

Refer to host system for actual result text.

## 2023-04-03 ENCOUNTER — Other Ambulatory Visit: Payer: Self-pay | Admitting: Family Medicine

## 2023-04-03 DIAGNOSIS — N6452 Nipple discharge: Secondary | ICD-10-CM
